# Patient Record
Sex: Female | Born: 1975
Health system: Southern US, Community
[De-identification: ages and names within clinical notes are randomized; demographics above are authoritative.]

---

## 2020-11-09 ENCOUNTER — Other Ambulatory Visit: Payer: Self-pay

## 2020-11-09 ENCOUNTER — Emergency Department
Admission: EM | Admit: 2020-11-09 | Discharge: 2020-11-09 | Disposition: A | Discharge status: Home / Self Care | Payer: 59 | Attending: Emergency Medicine | Admitting: Emergency Medicine

## 2020-11-09 ENCOUNTER — Emergency Department: Payer: 59

## 2020-11-09 DIAGNOSIS — Z5321 Procedure and treatment not carried out due to patient leaving prior to being seen by health care provider: Secondary | ICD-10-CM | POA: Diagnosis not present

## 2020-11-09 DIAGNOSIS — R079 Chest pain, unspecified: Secondary | ICD-10-CM | POA: Insufficient documentation

## 2020-11-09 DIAGNOSIS — R0602 Shortness of breath: Secondary | ICD-10-CM | POA: Insufficient documentation

## 2020-11-09 LAB — COMPREHENSIVE METABOLIC PANEL
ALT: 14 U/L (ref 0–44)
AST: 21 U/L (ref 15–41)
Albumin: 3.5 g/dL (ref 3.5–5.0)
Alkaline Phosphatase: 74 U/L (ref 38–126)
Anion gap: 9 (ref 5–15)
BUN: 14 mg/dL (ref 6–20)
CO2: 23 mmol/L (ref 22–32)
Calcium: 8.6 mg/dL — ABNORMAL LOW (ref 8.9–10.3)
Chloride: 105 mmol/L (ref 98–111)
Creatinine, Ser: 0.74 mg/dL (ref 0.44–1.00)
GFR, Estimated: >60 mL/min (ref >60)
Glucose, Bld: 128 mg/dL — ABNORMAL HIGH (ref 70–99)
Potassium: 3.4 mmol/L — ABNORMAL LOW (ref 3.5–5.1)
Sodium: 137 mmol/L (ref 135–145)
Total Bilirubin: 0.5 mg/dL (ref 0.3–1.2)
Total Protein: 7.5 g/dL (ref 6.5–8.1)

## 2020-11-09 LAB — TROPONIN I (HIGH SENSITIVITY): Troponin I (High Sensitivity): 5 ng/L (ref <18)

## 2020-11-09 LAB — CBC
HCT: 28.6 % — ABNORMAL LOW (ref 36.0–46.0)
Hemoglobin: 8.2 g/dL — ABNORMAL LOW (ref 12.0–15.0)
MCH: 16.8 pg — ABNORMAL LOW (ref 26.0–34.0)
MCHC: 28.7 g/dL — ABNORMAL LOW (ref 30.0–36.0)
MCV: 58.7 fL — ABNORMAL LOW (ref 80.0–100.0)
Platelets: 481 10*3/uL — ABNORMAL HIGH (ref 150–400)
RBC: 4.87 MIL/uL (ref 3.87–5.11)
RDW: 22.4 % — ABNORMAL HIGH (ref 11.5–15.5)
WBC: 3.7 10*3/uL — ABNORMAL LOW (ref 4.0–10.5)
nRBC: 0 % (ref 0.0–0.2)

## 2020-11-09 NOTE — ED Notes (Signed)
No answer when called x 2

## 2020-11-09 NOTE — ED Triage Notes (Signed)
First Nurse: Pt c/o SOB and Chest pain started at 0930 this am. 81mg  ASA given by EMS.

## 2020-11-09 NOTE — ED Triage Notes (Signed)
Pt states that this AM she started having CP and SHOB- pt states the CP has "eased up a lot"- pt states CP was in the right chest- pt was placed on O2 by EMS

## 2020-11-09 NOTE — ED Notes (Signed)
No answer when called to room

## 2022-07-22 IMAGING — CR DG CHEST 2V
1 series · 2 of 2 positions shown · non-contrast
Comparison: No priors.

CLINICAL DATA: 45-year-old female with history of chest pain.

EXAM:
CHEST - 2 VIEW

[Series 1: dg chest 2 view · 0.14mm/px · 2 of 2 slices shown]
[im 1/2]
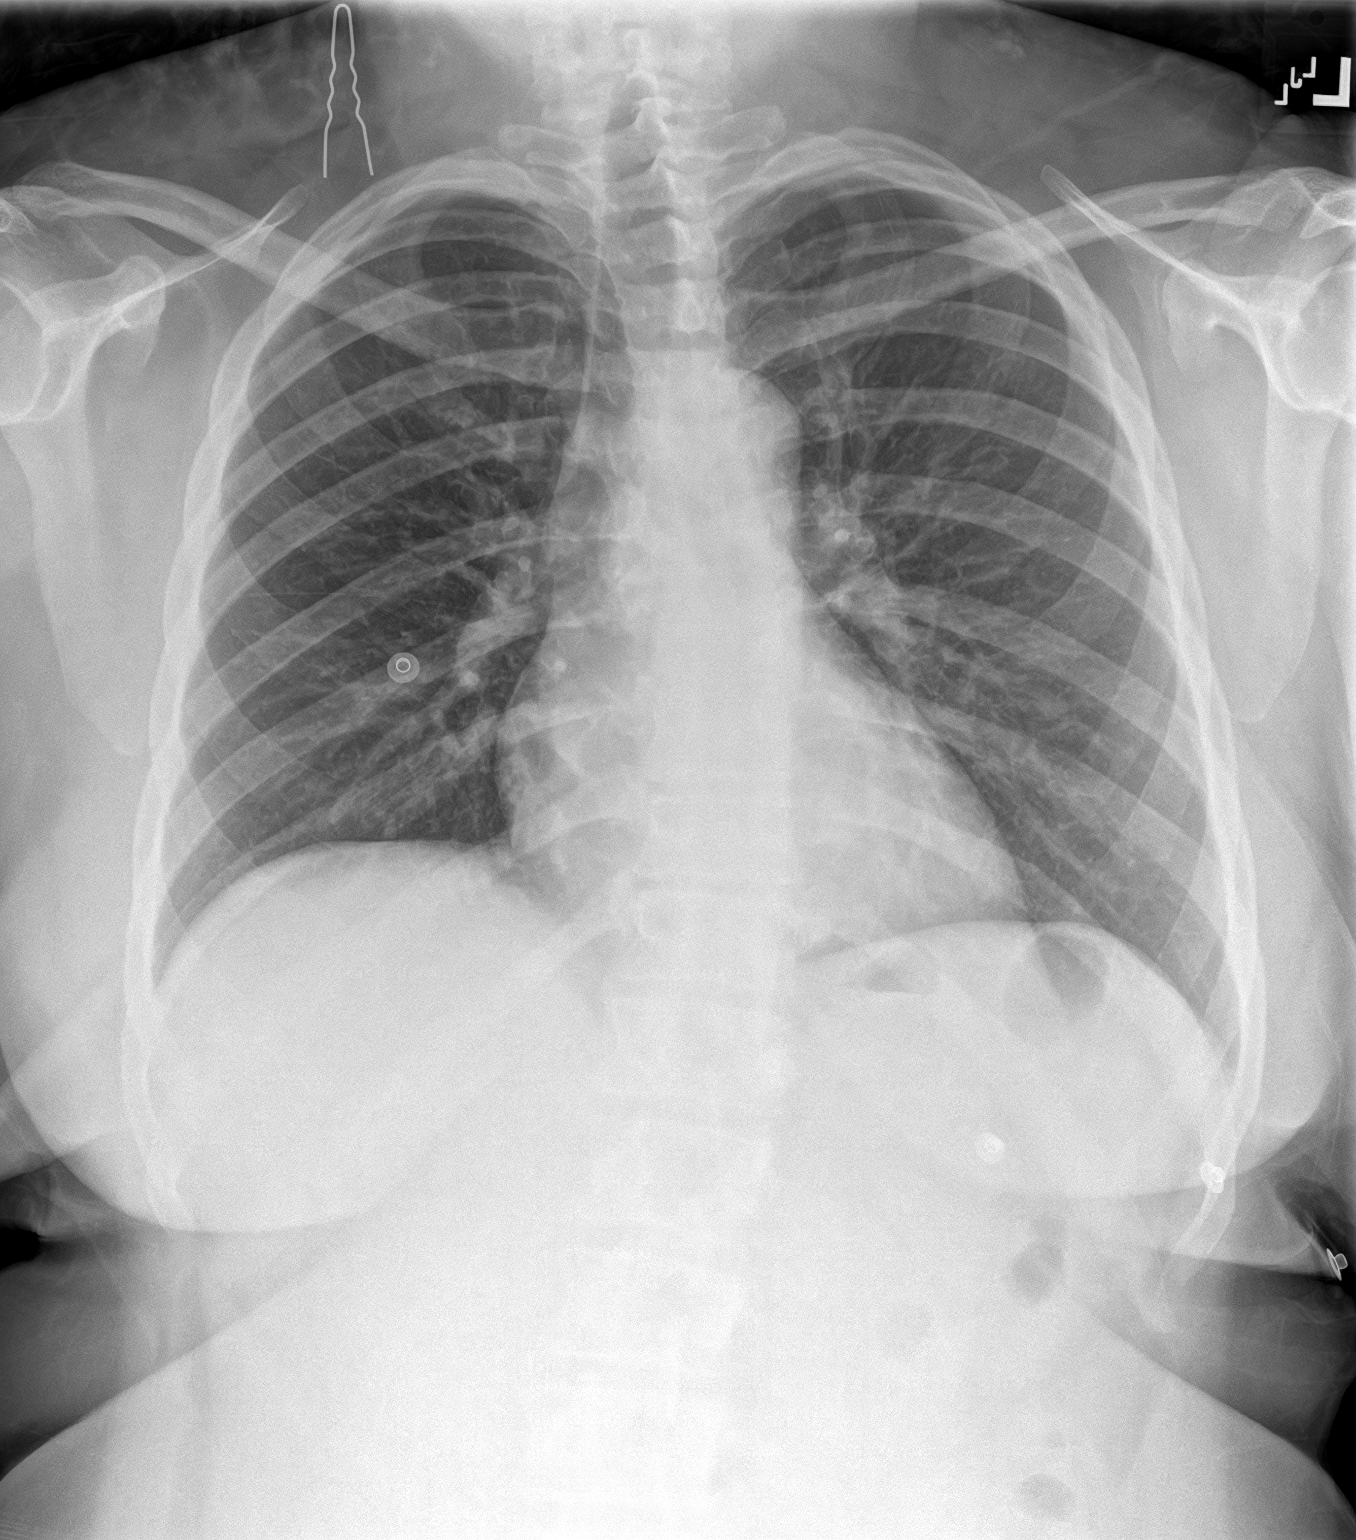
[im 2/2]
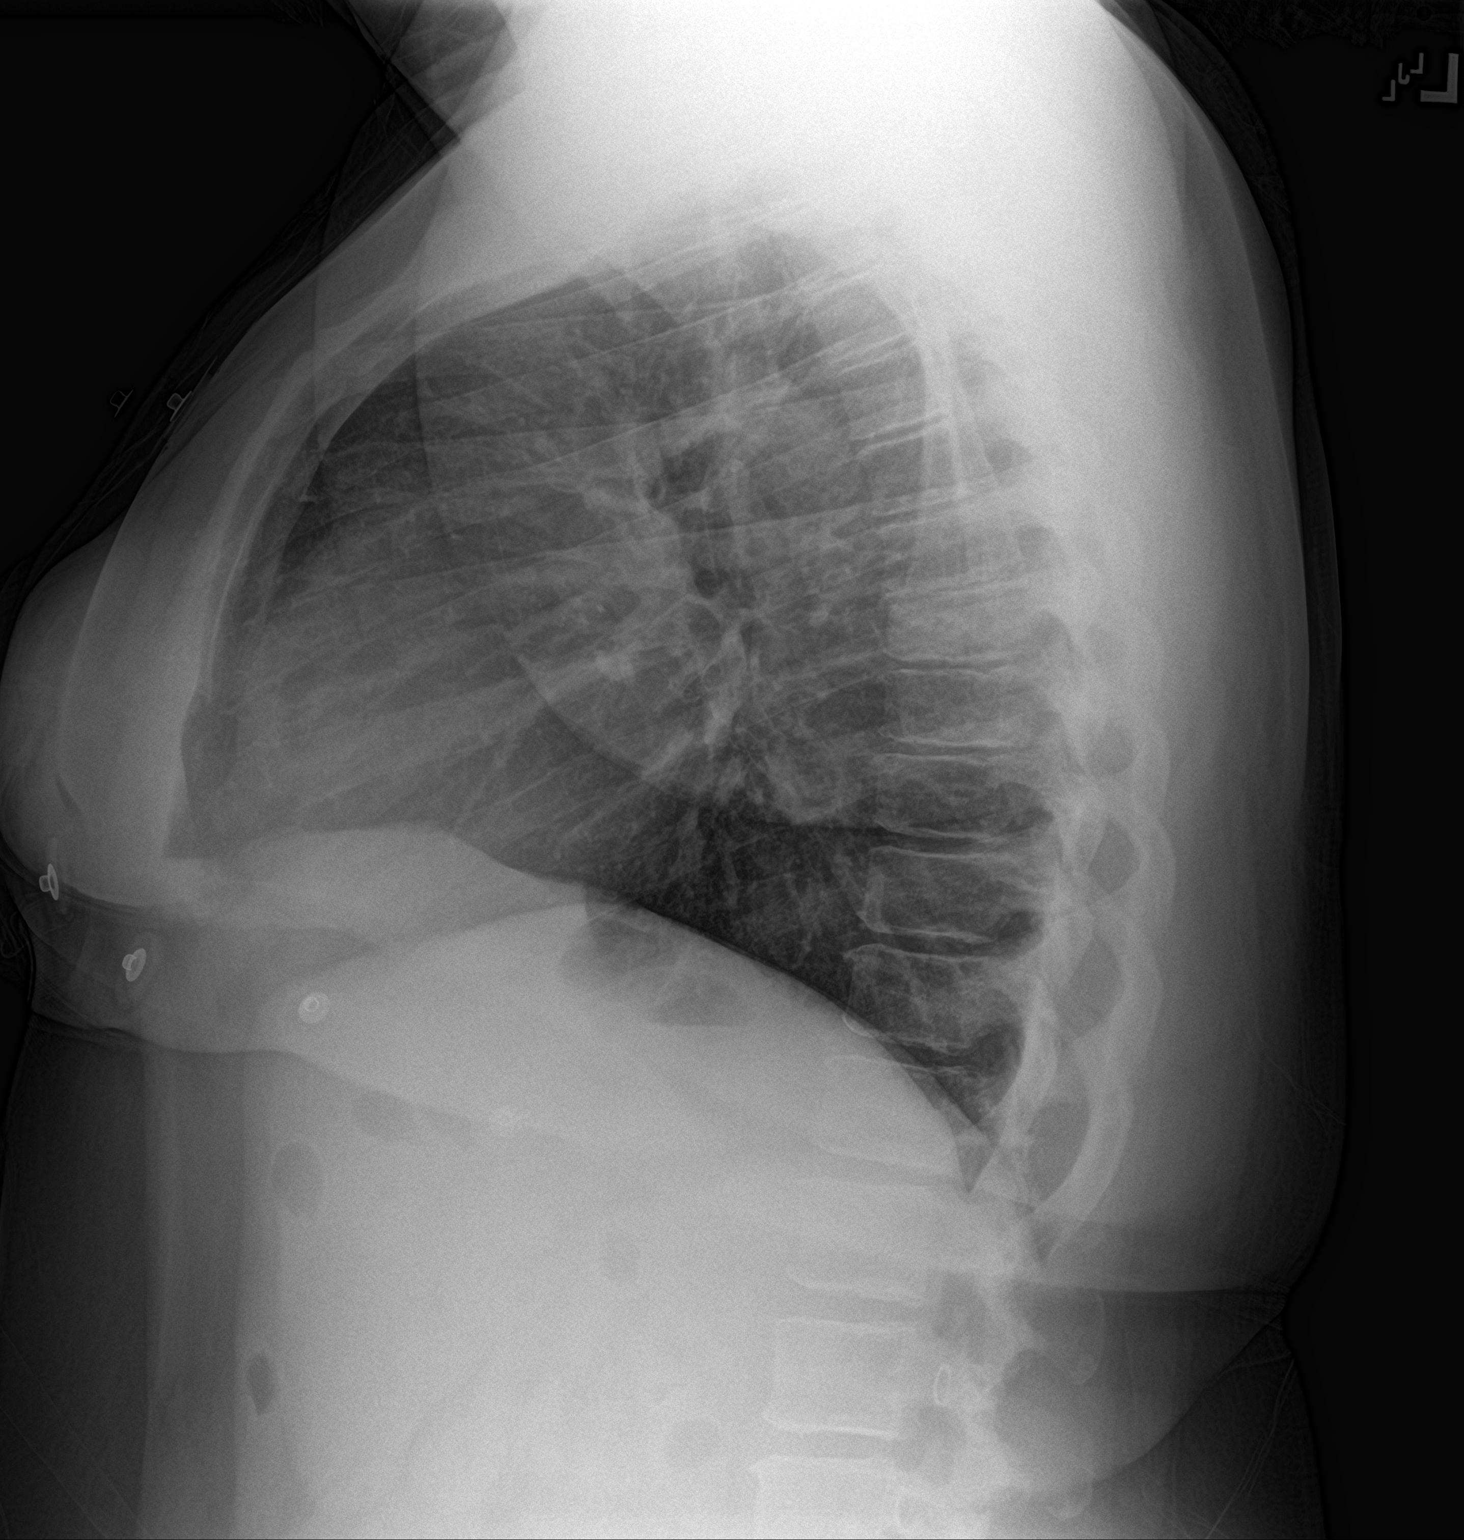

[2 of 2 positions shown; findings below may reference images not displayed]

FINDINGS: Lung volumes are normal. No consolidative airspace disease. No
pleural effusions. No pneumothorax. No pulmonary nodule or mass
noted. Pulmonary vasculature and the cardiomediastinal silhouette
are within normal limits.
IMPRESSION: No radiographic evidence of acute cardiopulmonary disease.
